# Patient Record
Sex: Male | Born: 1955 | Race: White | Hispanic: No | Marital: Married
Health system: Southern US, Community
[De-identification: ages and names within clinical notes are randomized; demographics above are authoritative.]

---

## 2015-03-20 DIAGNOSIS — F329 Major depressive disorder, single episode, unspecified: Secondary | ICD-10-CM | POA: Insufficient documentation

## 2015-03-20 DIAGNOSIS — F32A Depression, unspecified: Secondary | ICD-10-CM | POA: Insufficient documentation

## 2015-03-20 DIAGNOSIS — M109 Gout, unspecified: Secondary | ICD-10-CM | POA: Insufficient documentation

## 2015-04-27 DIAGNOSIS — J849 Interstitial pulmonary disease, unspecified: Secondary | ICD-10-CM | POA: Insufficient documentation

## 2016-02-07 DIAGNOSIS — G4730 Sleep apnea, unspecified: Secondary | ICD-10-CM | POA: Insufficient documentation

## 2017-03-28 DIAGNOSIS — M25312 Other instability, left shoulder: Secondary | ICD-10-CM | POA: Insufficient documentation

## 2018-04-06 ENCOUNTER — Ambulatory Visit (INDEPENDENT_AMBULATORY_CARE_PROVIDER_SITE_OTHER): Payer: BLUE CROSS/BLUE SHIELD | Admitting: Urology

## 2018-04-06 ENCOUNTER — Ambulatory Visit
Admission: RE | Admit: 2018-04-06 | Discharge: 2018-04-06 | Disposition: A | Discharge status: Home / Self Care | Payer: BLUE CROSS/BLUE SHIELD | Source: Ambulatory Visit | Attending: Urology | Admitting: Urology

## 2018-04-06 ENCOUNTER — Telehealth: Payer: Self-pay | Admitting: Radiology

## 2018-04-06 ENCOUNTER — Ambulatory Visit: Payer: BLUE CROSS/BLUE SHIELD

## 2018-04-06 ENCOUNTER — Other Ambulatory Visit: Payer: Self-pay | Admitting: Radiology

## 2018-04-06 ENCOUNTER — Encounter: Payer: Self-pay | Admitting: Urology

## 2018-04-06 DIAGNOSIS — R109 Unspecified abdominal pain: Secondary | ICD-10-CM

## 2018-04-06 DIAGNOSIS — N201 Calculus of ureter: Secondary | ICD-10-CM | POA: Insufficient documentation

## 2018-04-06 DIAGNOSIS — N200 Calculus of kidney: Secondary | ICD-10-CM

## 2018-04-06 LAB — URINALYSIS, COMPLETE
Bilirubin, UA: NEGATIVE
Glucose, UA: NEGATIVE
Ketones, UA: NEGATIVE
Leukocytes, UA: NEGATIVE
Nitrite, UA: NEGATIVE
PROTEIN UA: NEGATIVE
RBC, UA: NEGATIVE
Specific Gravity, UA: 1.015 (ref 1.005–1.030)
Urobilinogen, Ur: 0.2 mg/dL (ref 0.2–1.0)
pH, UA: 7 (ref 5.0–7.5)

## 2018-04-06 LAB — MICROSCOPIC EXAMINATION
Bacteria, UA: NONE SEEN
Epithelial Cells (non renal): NONE SEEN /hpf (ref 0–10)
RBC, UA: NONE SEEN /hpf (ref 0–2)
WBC, UA: NONE SEEN /hpf (ref 0–5)

## 2018-04-06 NOTE — Progress Notes (Signed)
2019 December ECG 12 lead;03/05/2018 [facility] Result Impression  Sinus bradycardia Minimal voltage criteria for LVH, may be normal variant Borderline ECG No previous ECGs available Confirmed by [place]ia [place], JIMMY ([id number]) on 03/05/2018 9:19:31 AM  Result Narrative  Ventricular Rate: 55 Atrial Rate: 55 P-R Interval: 176 QRS Duration: 88 Q-T Interval: 422 QTC Calculation(Bazett): 403 P Axis: 37 R Axis: -6 T Axis: 28  Other Result Information  Interface, Muse Results In - 03/05/2018  9:19 AM EST Ventricular Rate: 55 Atrial Rate: 55 P-R Interval: 176 QRS Duration: 88 Q-T Interval: 422 QTC Calculation(Bazett): 403 P Axis: 37 R Axis: -6 T Axis: 28 Impression: Sinus bradycardia Minimal voltage criteria for LVH, may be normal variant Borderline ECG No previous ECGs available Confirmed by LOCKLEAR, JIMMY ([id number]) on 03/05/2018 9:19:31 AM

## 2018-04-06 NOTE — Telephone Encounter (Signed)
Notified patient of extracorporeal shockwave lithotripsy scheduled on 04/09/2018 at 10:15. Patient should arrive at that time to [facility] Day Surgery. Pre-op & discharge instructions for lithotripsy were provided to patient.    Advised patient to hold NSAIDS & aspirin products for 3 days prior to the procedure beginning on 04/06/2018.  Patient's questions were answered & he expresses understanding of these instructions.    Nicolasa Ducking, RN

## 2018-04-06 NOTE — Progress Notes (Signed)
04/06/2018 11:28 AM   Jamie Robertson 1955/07/12 [id number]  Referring provider: Storm Frisk, MD No address on file  Chief Complaint  Patient presents with  . Nephrolithiasis    HPI: Jamie Robertson is a 63 y.o. male with possible kidney stones who presents today to establish urological care.  Since the first week of January the patient has felt intermittent left flank pain, radiating into the left groin region.  At worst, the pain is rated 7/10.  Worse with deep breathing and bending to the left.  Patient denies nausea, vomiting, urinary symptoms, gross hematuria.  Patient denies history of stones, does admit history of gout.  PMH: No past medical history on file.  Surgical History: History reviewed. No pertinent surgical history.  Home Medications:  Allergies as of 04/06/2018      Reactions   Diclofenac Sodium Rash   Cephalexin       Medication List       Accurate as of April 06, 2018 11:28 AM. Always use your most recent med list.        allopurinol 100 MG tablet Commonly known as:  ZYLOPRIM   aspirin EC 81 MG tablet Take by mouth.   baclofen 10 MG tablet Commonly known as:  LIORESAL   buPROPion 150 MG 24 hr tablet Commonly known as:  WELLBUTRIN XL   colchicine 0.6 MG tablet TAKE 2 TABLETS BY MOUTH FOR 1ST DOSE, THEN 1 TAB TWICE DAILY UNTIL IMPROVED   EPINEPHrine 0.3 mg/0.3 mL Soaj injection Commonly known as:  EPI-PEN USE AS DIRECTED BY PHYSICIAN   lovastatin 10 MG tablet Commonly known as:  MEVACOR every morning.   meloxicam 7.5 MG tablet Commonly known as:  MOBIC TAKE 1-2 TABLETS BY MOUTH DAILY AS NEEDED FOR PAIN.   tamsulosin 0.4 MG Caps capsule Commonly known as:  FLOMAX   UNABLE TO FIND Take by mouth. Tumeric PO   UNABLE TO FIND Take by mouth. Cherry Tart Sour       Allergies:  Allergies  Allergen Reactions  . Diclofenac Sodium Rash  . Cephalexin     Family History: No family history on file.  Social History:   has no history on file for tobacco, alcohol, and drug.  ROS: UROLOGY Frequent Urination?: No Hard to postpone urination?: No Burning/pain with urination?: No Get up at night to urinate?: No Leakage of urine?: No Urine stream starts and stops?: No Trouble starting stream?: No Do you have to strain to urinate?: No Blood in urine?: No Urinary tract infection?: No Sexually transmitted disease?: No Injury to kidneys or bladder?: No Painful intercourse?: No Weak stream?: No Erection problems?: No Penile pain?: No  Gastrointestinal Nausea?: No Vomiting?: No Indigestion/heartburn?: No Diarrhea?: No Constipation?: No  Constitutional Fever: No Night sweats?: No Weight loss?: No Fatigue?: No  Skin Skin rash/lesions?: No Itching?: No  Eyes Blurred vision?: No Double vision?: No  Ears/Nose/Throat Sore throat?: No Sinus problems?: No  Hematologic/Lymphatic Swollen glands?: No Easy bruising?: No  Cardiovascular Leg swelling?: No Chest pain?: No  Respiratory Cough?: No Shortness of breath?: No  Endocrine Excessive thirst?: No  Musculoskeletal Back pain?: Yes Joint pain?: No  Neurological Headaches?: No Dizziness?: No  Psychologic Depression?: Yes Anxiety?: No  Physical Exam: BP (!) 175/96 (BP Location: Left Arm, Patient Position: Sitting, Cuff Size: Normal)   Pulse 68   Ht 5\' 11"  (1.803 m)   Wt 198 lb 12.8 oz (90.2 kg)   BMI 27.73 kg/m   Constitutional:  Well nourished. Alert  and oriented, No acute distress. Cardiovascular: No clubbing, cyanosis, or edema. Respiratory: Normal respiratory effort, no increased work of breathing. GI: Abdomen is soft, mild left CVA tenderness, mild LLQ tenderness, non distended, no abdominal masses. Liver and spleen not palpable.  No hernias appreciated.  Stool sample for occult testing is not indicated. Skin: No rashes, bruises or suspicious lesions. Neurologic: Grossly intact, no focal deficits, moving all 4  extremities. Psychiatric: Normal mood and affect.  Laboratory Data:  Urinalysis Microscopy, dipstick negative  Pertinent Imaging: KUB obtained today was reviewed and there is a 3 x 5 mm calcification alongside the left sacrum suspicious for a distal ureteral calculus.  Assessment & Plan:    1. Left Flank Pain KUB suspicious for a distal ureteral calculus.  Urinalysis is negative.  He is specifically interested in shockwave lithotripsy since he has been having intermittent pain for the past month.  Will obtain a stone protocol CT of the abdomen pelvis to verify this is indeed a calculus.  The indications and nature of the planned procedure were discussed as well as the potential benefits and expected outcome.  Alternatives were discussed as described above.  The most common complications and side effects were discussed as outlined in the [facility] consent form.  It was stressed that there is no guarantee that lithotripsy will be successful and he could require retreatment or alternative treatment. The possibility of renal colic from obstructing stone fragments requiring stent placement or ureteroscopy was also discussed.   Riki Altes, MD  [facility] [address], [address] [place], [place] [address] [phone]  I, Duanne Moron, am acting as a scribe for Irineo Axon, MD.    I, Riki Altes, MD, have reviewed all documentation for this visit. The documentation on 04/06/18 for the exam, diagnosis, procedures, and orders are all accurate and complete.

## 2018-04-08 ENCOUNTER — Other Ambulatory Visit: Payer: Self-pay | Admitting: Urology

## 2018-04-09 ENCOUNTER — Encounter: Admission: RE | Payer: Self-pay | Source: Home / Self Care

## 2018-04-09 ENCOUNTER — Ambulatory Visit: Admission: RE | Admit: 2018-04-09 | Payer: BLUE CROSS/BLUE SHIELD | Source: Home / Self Care | Admitting: Urology

## 2018-04-09 SURGERY — LITHOTRIPSY, ESWL
Anesthesia: Moderate Sedation | Laterality: Left

## 2020-04-29 IMAGING — CR DG ABDOMEN 1V
2 series · 2 of 2 positions shown · non-contrast
Comparison: None.

CLINICAL DATA: Left-sided pain for 1 month.  Nephrolithiasis.

EXAM:
ABDOMEN - 1 VIEW

[abdomen kub (1 of 2)]
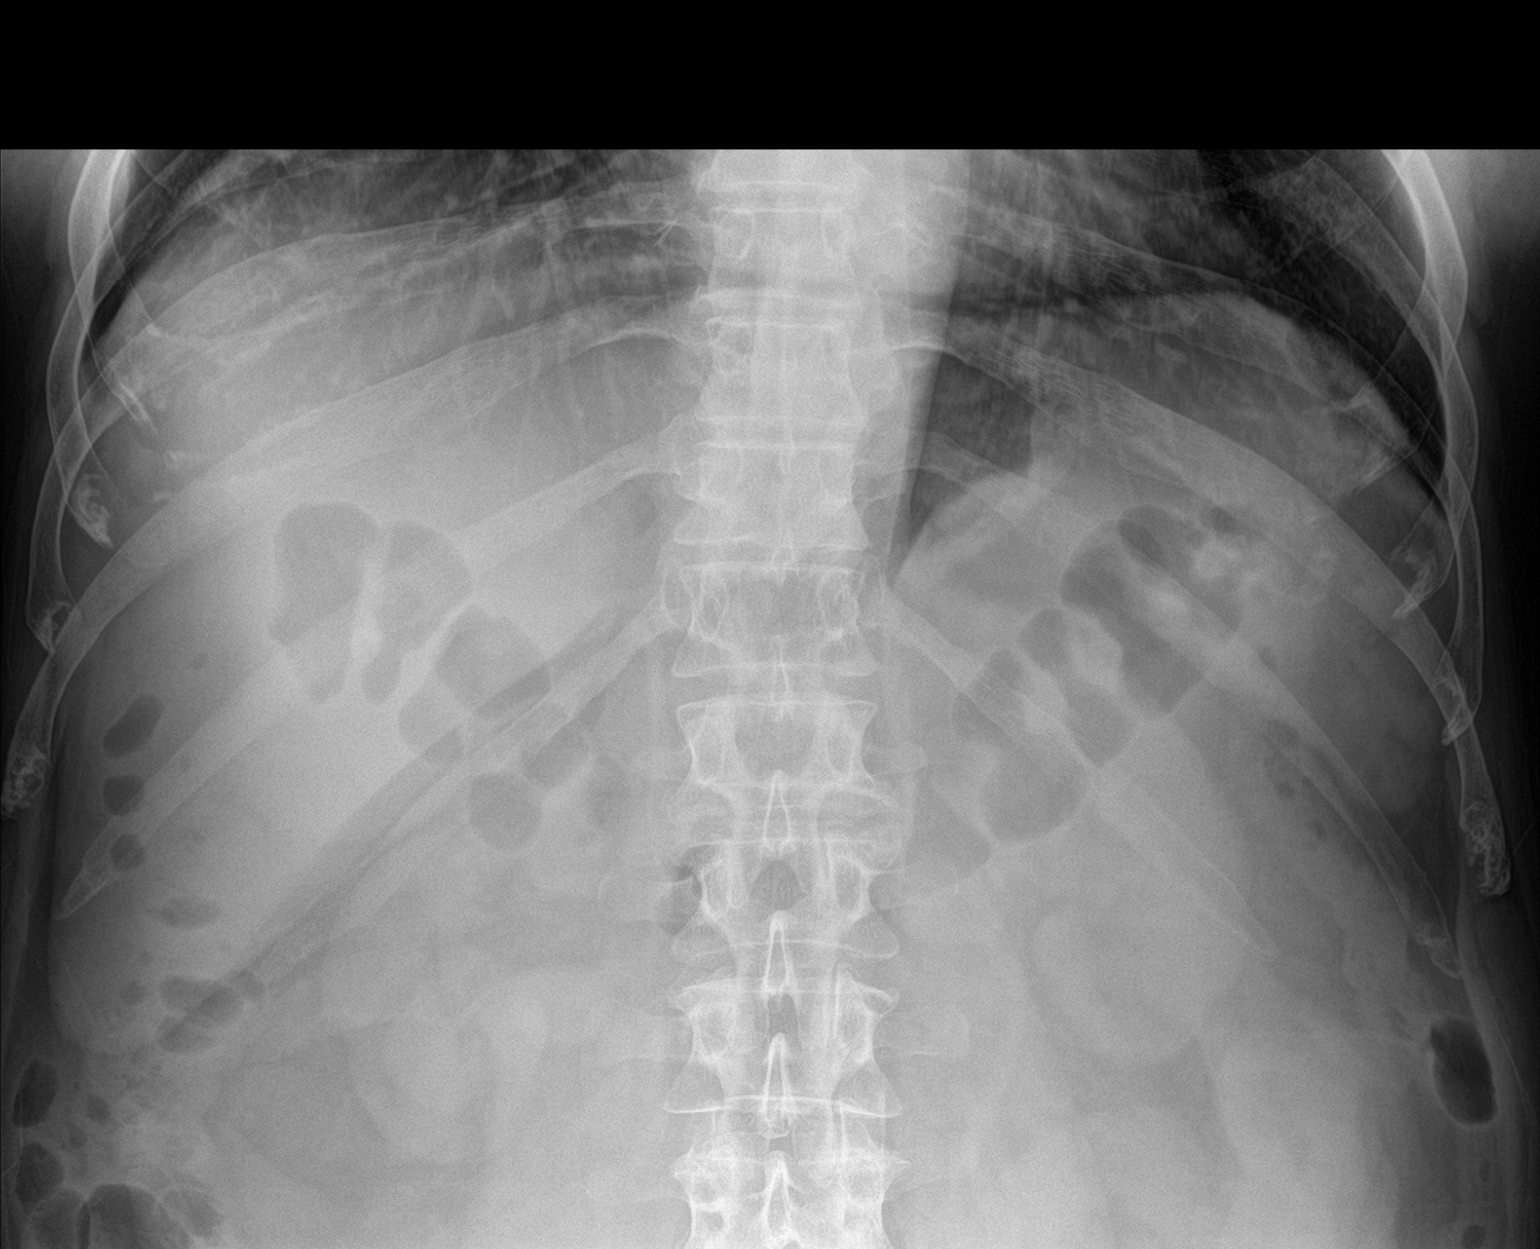

[abdomen kub (2 of 2)]
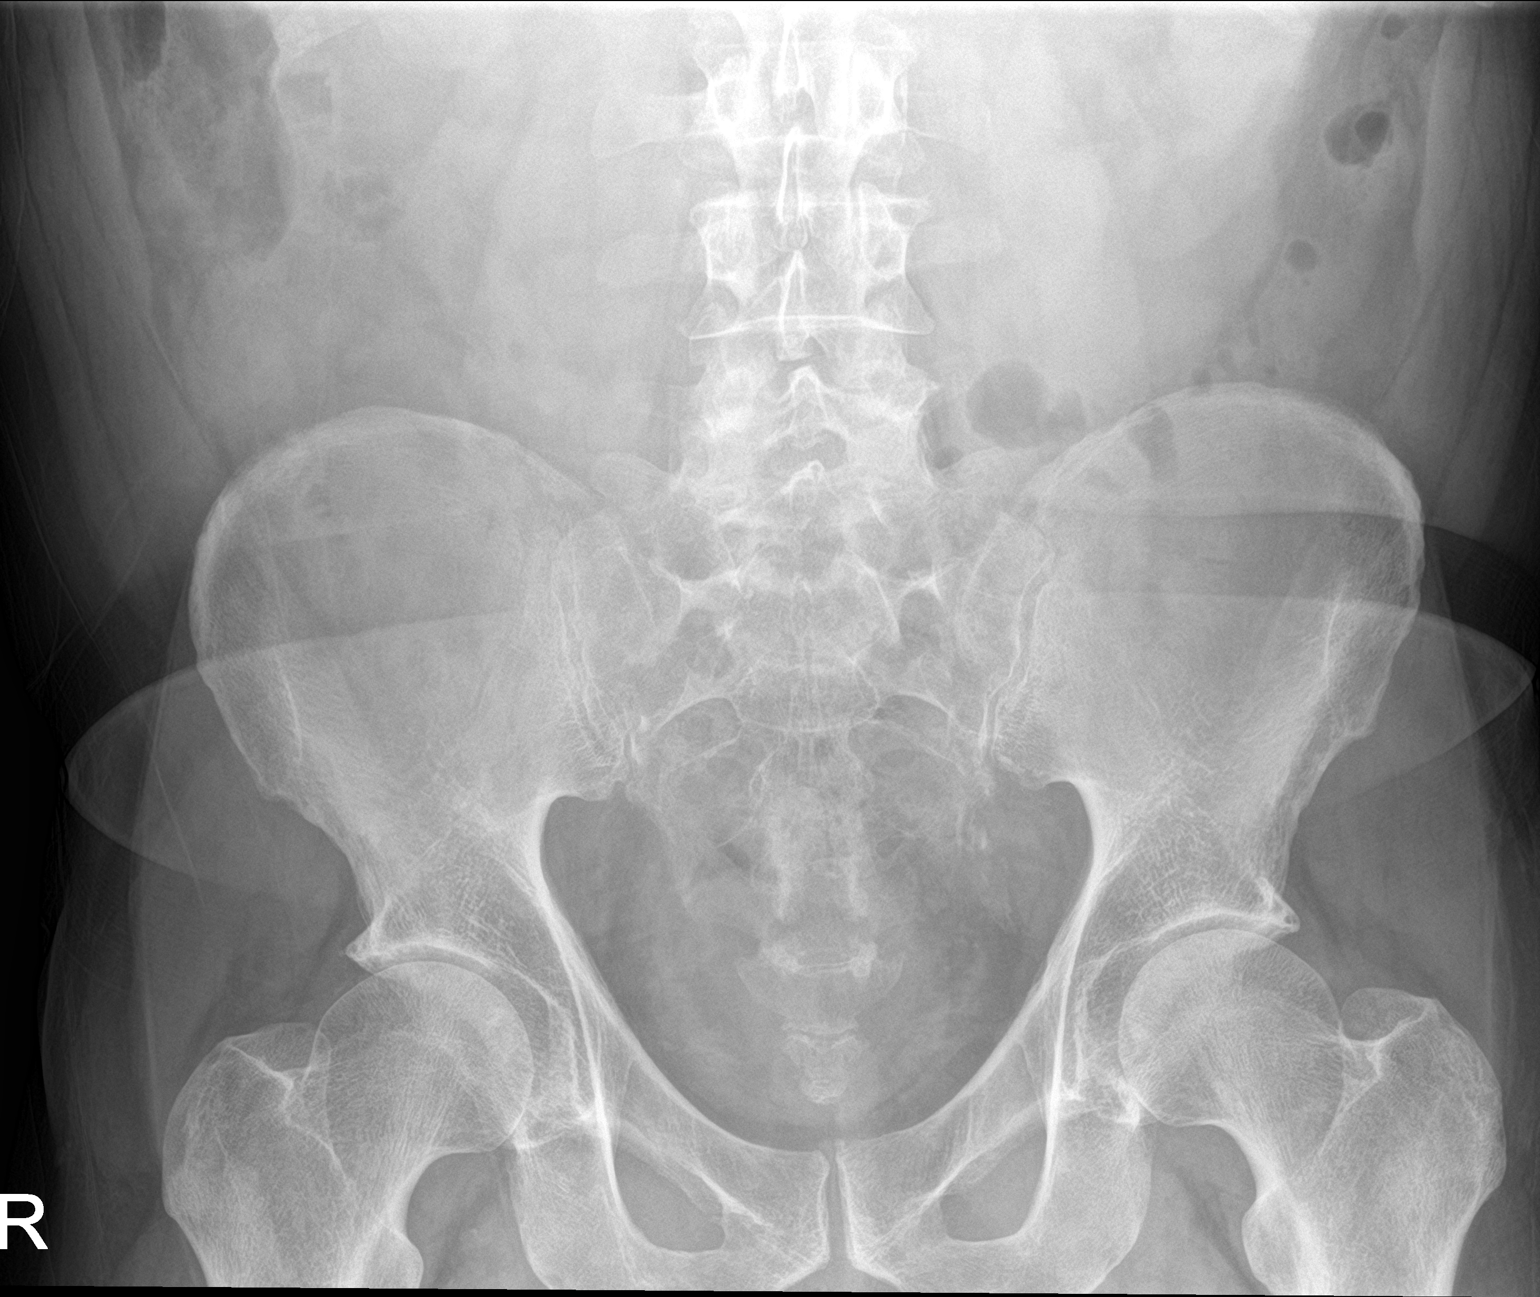

[2 of 2 positions shown; findings below may reference images not displayed]

FINDINGS: There are no calcifications projecting over the renal shadows. There
are no disproportionally dilated loops of bowel. There is no obvious
free intraperitoneal gas.
IMPRESSION: Nonobstructive bowel gas pattern.

No evidence of nephrolithiasis.
